# Patient Record
Sex: Male | Born: 1965 | Race: White | Hispanic: No | Marital: Single | Smoking: Never smoker
Health system: Southern US, Community
[De-identification: ages and names within clinical notes are randomized; demographics above are authoritative.]

## PROBLEM LIST (undated history)

## (undated) DIAGNOSIS — F329 Major depressive disorder, single episode, unspecified: Secondary | ICD-10-CM

## (undated) DIAGNOSIS — F32A Depression, unspecified: Secondary | ICD-10-CM

## (undated) DIAGNOSIS — I10 Essential (primary) hypertension: Secondary | ICD-10-CM

## (undated) HISTORY — DX: Major depressive disorder, single episode, unspecified: F32.9

## (undated) HISTORY — DX: Depression, unspecified: F32.A

## (undated) HISTORY — DX: Essential (primary) hypertension: I10

## (undated) HISTORY — PX: OTHER SURGICAL HISTORY: SHX169

---

## 2007-01-05 ENCOUNTER — Emergency Department (HOSPITAL_COMMUNITY): Admission: EM | Admit: 2007-01-05 | Discharge: 2007-01-05 | Payer: Self-pay | Admitting: Family Medicine

## 2017-08-17 ENCOUNTER — Ambulatory Visit (INDEPENDENT_AMBULATORY_CARE_PROVIDER_SITE_OTHER): Payer: PRIVATE HEALTH INSURANCE | Admitting: Family Medicine

## 2017-08-17 ENCOUNTER — Encounter: Payer: Self-pay | Admitting: Family Medicine

## 2017-08-17 VITALS — BP 152/100 | HR 76 | Temp 99.2°F | Ht 66.0 in | Wt 164.1 lb

## 2017-08-17 DIAGNOSIS — R5383 Other fatigue: Secondary | ICD-10-CM | POA: Diagnosis not present

## 2017-08-17 DIAGNOSIS — Z1211 Encounter for screening for malignant neoplasm of colon: Secondary | ICD-10-CM

## 2017-08-17 DIAGNOSIS — F321 Major depressive disorder, single episode, moderate: Secondary | ICD-10-CM | POA: Insufficient documentation

## 2017-08-17 DIAGNOSIS — Z13220 Encounter for screening for lipoid disorders: Secondary | ICD-10-CM

## 2017-08-17 DIAGNOSIS — R030 Elevated blood-pressure reading, without diagnosis of hypertension: Secondary | ICD-10-CM

## 2017-08-17 DIAGNOSIS — Z0001 Encounter for general adult medical examination with abnormal findings: Secondary | ICD-10-CM | POA: Diagnosis not present

## 2017-08-17 NOTE — Progress Notes (Signed)
Subjective:  AZAD CALAME is a 51 y.o. male who presents today for his annual comprehensive physical exam.    HPI:  DASAN HARDMAN has 1 acute complaint today:  Fatigue, new issue Started several months to a year ago.  Has worsened over that time.  Associated symptoms include decreased libido.  He also occasionally has feelings of being down.  He sleeps okay but will occasionally wake up in the middle of the night.  He does have a history of depression and has been on sertraline in the past.  He is not currently interested in any treatment for his depression.  He will be looking into [facility] therapy.  He does not doze off during the day.  Is not sure if he snores.  No paroxysmal nocturnal dyspnea.  PFSH: Current alcohol user.  Drinks about 14-18 drinks per week.  Lifestyle Diet: Room for improvement.  Has been trying to eat more protein and cut out carbs. Exercise: Does not regularly exercise  Depression screen PHQ 2/9 08/17/2017  Decreased Interest 3  Down, Depressed, Hopeless 2  PHQ - 2 Score 5  Altered sleeping 2  Tired, decreased energy 3  Change in appetite 1  Feeling bad or failure about yourself  2  Trouble concentrating 2  Moving slowly or fidgety/restless 2  Suicidal thoughts 0  PHQ-9 Score 17  Difficult doing work/chores Very difficult    Health Maintenance Due  Topic Date Due  . HIV Screening  08/16/1981  . TETANUS/TDAP  08/16/1985  . COLONOSCOPY  08/16/2016    ROS: Per HPI, otherwise a 14 point review of systems was performed and was negative  PMH:  The following were reviewed and entered/updated in epic: History reviewed. No pertinent past medical history. Patient Active Problem List   Diagnosis Date Noted  . Fatigue 08/17/2017  . Depression, major, single episode, moderate ([place]) 08/17/2017   History reviewed. No pertinent surgical history.  Family History  Problem Relation Age of Onset  . COPD Father   . Heart disease Father   . Arthritis  Maternal Grandmother   . Heart attack Paternal Grandmother   . Heart attack Paternal Grandfather     Medications- reviewed and updated No current outpatient prescriptions on file.   No current facility-administered medications for this visit.     Allergies-reviewed and updated No Known Allergies  Social History   Social History  . Marital status: Single    Spouse name: N/A  . Number of children: N/A  . Years of education: N/A   Social History Main Topics  . Smoking status: Never Smoker  . Smokeless tobacco: Never Used  . Alcohol use Yes  . Drug use: No  . Sexual activity: Not Currently   Other Topics Concern  . None   Social History Narrative  . None    Objective:  Physical Exam: BP (!) 152/100   Pulse 76   Temp 99.2 F (37.3 C) (Oral)   Ht 5\' 6"  (1.676 m)   Wt 164 lb 2 oz (74.4 kg)   SpO2 98%   BMI 26.49 kg/m   Body mass index is 26.49 kg/m. Gen: NAD, resting comfortably HEENT: TMs normal bilaterally. OP clear. No thyromegaly noted.  CV: RRR with no murmurs appreciated Pulm: NWOB, CTAB with no crackles, wheezes, or rhonchi GI: Normal bowel sounds present. Soft, Nontender, Nondistended. MSK: no edema, cyanosis, or clubbing noted Skin: warm, dry Neuro: CN2-12 grossly intact. Strength 5/5 in upper and lower extremities. Reflexes symmetric  and intact bilaterally.  Psych: Normal affect and thought content  Assessment/Plan:  Fatigue Broad differential.  Likely related to patient's depression.  He did have elevated PHQ score of 17 today.  Will rule out organic etiology.  Check CBC, CMET, TSH, B12, vitamin D, and testosterone.  Patient is not interested in pharmacotherapy or psychotherapy for his depression at this point.  He will be looking into [facility] therapy.  Discussed reasons to return to care and advised patient to schedule appointment with me if he changes his mind about either pharmacotherapy or psychotherapy.  Depression, major, single episode, moderate  ([place]) See fatigue assessment and plan.  Elevated blood pressure reading Patient has been previously well controlled per his report.  Advised patient continue checking blood pressure at home and let us know if persistently elevated above 140/90.  Patient voiced understanding.  Advised him to follow-up in the office within 3-6 months.   Preventative Healthcare: Flu shot deferred.  Will check lipid panel.  Check glucose on CMET.  Referral placed for colonoscopy.  Patient Counseling:  -Nutrition: Stressed importance of moderation in sodium/caffeine intake, saturated fat and cholesterol, caloric balance, sufficient intake of fresh fruits, vegetables, and fiber.  -Stressed the importance of regular exercise.   -Substance Abuse: Discussed cessation/primary prevention of tobacco, alcohol, or other drug use; driving or other dangerous activities under the influence; availability of treatment for abuse.   -Injury prevention: Discussed safety belts, safety helmets, smoke detector, smoking near bedding or upholstery.   -Sexuality: Discussed sexually transmitted diseases, partner selection, use of condoms, avoidance of unintended pregnancy and contraceptive alternatives.   -Dental health: Discussed importance of regular tooth brushing, flossing, and dental visits.  -Health maintenance and immunizations reviewed. Please refer to Health maintenance section.  Return to care in 1 year for next preventative visit.   Algis Greenhouse. Jerline Pain, MD 08/17/2017 5:03 PM

## 2017-08-17 NOTE — Patient Instructions (Signed)
We will check blood work today.  Let us know if your blood pressure is persistently elevated above 140/90.  Come back to see me in 3-6 months, or sooner as needed.  I placed your referral for your colonoscopy.   Take care,  Dr Jerline Pain

## 2017-08-17 NOTE — Assessment & Plan Note (Signed)
Broad differential.  Likely related to patient's depression.  He did have elevated PHQ score of 17 today.  Will rule out organic etiology.  Check CBC, CMET, TSH, B12, vitamin D, and testosterone.  Patient is not interested in pharmacotherapy or psychotherapy for his depression at this point.  He will be looking into [facility] therapy.  Discussed reasons to return to care and advised patient to schedule appointment with me if he changes his mind about either pharmacotherapy or psychotherapy.

## 2017-08-17 NOTE — Assessment & Plan Note (Signed)
See fatigue assessment and plan.

## 2017-08-18 ENCOUNTER — Other Ambulatory Visit (INDEPENDENT_AMBULATORY_CARE_PROVIDER_SITE_OTHER): Payer: PRIVATE HEALTH INSURANCE

## 2017-08-18 ENCOUNTER — Other Ambulatory Visit: Payer: PRIVATE HEALTH INSURANCE

## 2017-08-18 DIAGNOSIS — R5383 Other fatigue: Secondary | ICD-10-CM

## 2017-08-18 DIAGNOSIS — Z13220 Encounter for screening for lipoid disorders: Secondary | ICD-10-CM | POA: Diagnosis not present

## 2017-08-18 LAB — TESTOSTERONE: TESTOSTERONE: 304.23 ng/dL (ref 300.00–890.00)

## 2017-08-18 LAB — COMPREHENSIVE METABOLIC PANEL
ALT: 15 U/L (ref 0–53)
AST: 17 U/L (ref 0–37)
Albumin: 4.6 g/dL (ref 3.5–5.2)
Alkaline Phosphatase: 34 U/L — ABNORMAL LOW (ref 39–117)
BUN: 14 mg/dL (ref 6–23)
CALCIUM: 9.6 mg/dL (ref 8.4–10.5)
CHLORIDE: 102 meq/L (ref 96–112)
CO2: 32 meq/L (ref 19–32)
Creatinine, Ser: 0.95 mg/dL (ref 0.40–1.50)
GFR: 88.83 mL/min (ref >60.00)
Glucose, Bld: 87 mg/dL (ref 70–99)
Potassium: 4.9 mEq/L (ref 3.5–5.1)
Sodium: 140 mEq/L (ref 135–145)
Total Bilirubin: 0.4 mg/dL (ref 0.2–1.2)
Total Protein: 6.9 g/dL (ref 6.0–8.3)

## 2017-08-18 LAB — CBC
HCT: 47.9 % (ref 39.0–52.0)
HEMOGLOBIN: 15.8 g/dL (ref 13.0–17.0)
MCHC: 32.9 g/dL (ref 30.0–36.0)
MCV: 90 fl (ref 78.0–100.0)
PLATELETS: 220 10*3/uL (ref 150.0–400.0)
RBC: 5.33 Mil/uL (ref 4.22–5.81)
RDW: 14.1 % (ref 11.5–15.5)
WBC: 4.9 10*3/uL (ref 4.0–10.5)

## 2017-08-18 LAB — VITAMIN D 25 HYDROXY (VIT D DEFICIENCY, FRACTURES): VITD: 33.08 ng/mL (ref 30.00–100.00)

## 2017-08-18 LAB — LIPID PANEL
CHOL/HDL RATIO: 4
Cholesterol: 212 mg/dL — ABNORMAL HIGH (ref 0–200)
HDL: 54.7 mg/dL (ref >39.00)
LDL CALC: 131 mg/dL — AB (ref 0–99)
NONHDL: 156.81
TRIGLYCERIDES: 128 mg/dL (ref 0.0–149.0)
VLDL: 25.6 mg/dL (ref 0.0–40.0)

## 2017-08-18 LAB — TSH: TSH: 2.7 u[IU]/mL (ref 0.35–4.50)

## 2017-08-18 LAB — VITAMIN B12: VITAMIN B 12: 506 pg/mL (ref 211–911)

## 2017-08-27 ENCOUNTER — Encounter: Payer: Self-pay | Admitting: Internal Medicine

## 2017-09-15 ENCOUNTER — Ambulatory Visit (AMBULATORY_SURGERY_CENTER): Payer: Self-pay | Admitting: *Deleted

## 2017-09-15 ENCOUNTER — Other Ambulatory Visit: Payer: Self-pay

## 2017-09-15 VITALS — Ht 66.0 in | Wt 169.0 lb

## 2017-09-15 DIAGNOSIS — Z1211 Encounter for screening for malignant neoplasm of colon: Secondary | ICD-10-CM

## 2017-09-15 MED ORDER — NA SULFATE-K SULFATE-MG SULF 17.5-3.13-1.6 GM/177ML PO SOLN: 1.0000 | Freq: Once | ORAL | 0 refills | Status: AC

## 2017-09-15 NOTE — Progress Notes (Signed)
No egg or soy allergy known to patient  No issues with past sedation with any surgeries  or procedures, no intubation problems  No diet pills per patient No home 02 use per patient  No blood thinners per patient  Pt denies issues with constipation  No A fib or A flutter  EMMI video sent to pt's e mail -- pt declined $15 coupon for suprep in [place] today

## 2017-09-29 ENCOUNTER — Ambulatory Visit (AMBULATORY_SURGERY_CENTER): Payer: PRIVATE HEALTH INSURANCE | Admitting: Internal Medicine

## 2017-09-29 ENCOUNTER — Other Ambulatory Visit: Payer: Self-pay

## 2017-09-29 ENCOUNTER — Encounter: Payer: Self-pay | Admitting: Internal Medicine

## 2017-09-29 ENCOUNTER — Encounter: Payer: PRIVATE HEALTH INSURANCE | Admitting: Gastroenterology

## 2017-09-29 VITALS — BP 136/84 | HR 71 | Temp 97.7°F | Resp 12 | Ht 66.0 in | Wt 169.0 lb

## 2017-09-29 DIAGNOSIS — D120 Benign neoplasm of cecum: Secondary | ICD-10-CM

## 2017-09-29 DIAGNOSIS — D123 Benign neoplasm of transverse colon: Secondary | ICD-10-CM

## 2017-09-29 DIAGNOSIS — Z1211 Encounter for screening for malignant neoplasm of colon: Secondary | ICD-10-CM

## 2017-09-29 MED ORDER — SODIUM CHLORIDE 0.9 % IV SOLN: 500.0000 mL | Freq: Once | INTRAVENOUS | Status: DC

## 2017-09-29 NOTE — Patient Instructions (Signed)
   handout given: Polyps, Diverticulosis and Hemorrhoids.  YOU HAD AN ENDOSCOPIC PROCEDURE TODAY AT [facility]:   Refer to the procedure report that was given to you for any specific questions about what was found during the examination.  If the procedure report does not answer your questions, please call your gastroenterologist to clarify.  If you requested that your care partner not be given the details of your procedure findings, then the procedure report has been included in a sealed envelope for you to review at your convenience later.  YOU SHOULD EXPECT: Some feelings of bloating in the abdomen. Passage of more gas than usual.  Walking can help get rid of the air that was put into your GI tract during the procedure and reduce the bloating. If you had a lower endoscopy (such as a colonoscopy or flexible sigmoidoscopy) you may notice spotting of blood in your stool or on the toilet paper. If you underwent a bowel prep for your procedure, you may not have a normal bowel movement for a few days.  Please Note:  You might notice some irritation and congestion in your nose or some drainage.  This is from the oxygen used during your procedure.  There is no need for concern and it should clear up in a day or so.  SYMPTOMS TO REPORT IMMEDIATELY:   Following lower endoscopy (colonoscopy or flexible sigmoidoscopy):  Excessive amounts of blood in the stool  Significant tenderness or worsening of abdominal pains  Swelling of the abdomen that is new, acute  Fever of 100F or higher  For urgent or emergent issues, a gastroenterologist can be reached at any hour by calling [phone].   DIET:  We do recommend a small meal at first, but then you may proceed to your regular diet.  Drink plenty of fluids but you should avoid alcoholic beverages for 24 hours.  ACTIVITY:  You should plan to take it easy for the rest of today and you should NOT DRIVE or use heavy machinery until tomorrow  (because of the sedation medicines used during the test).    FOLLOW UP: Our staff will call the number listed on your records the next business day following your procedure to check on you and address any questions or concerns that you may have regarding the information given to you following your procedure. If we do not reach you, we will leave a message.  However, if you are feeling well and you are not experiencing any problems, there is no need to return our call.  We will assume that you have returned to your regular daily activities without incident.  If any biopsies were taken you will be contacted by phone or by letter within the next 1-3 weeks.  Please call us at [phone] if you have not heard about the biopsies in 3 weeks.    SIGNATURES/CONFIDENTIALITY: You and/or your care partner have signed paperwork which will be entered into your electronic medical record.  These signatures attest to the fact that that the information above on your After Visit Summary has been reviewed and is understood.  Full responsibility of the confidentiality of this discharge information lies with you and/or your care-partner.

## 2017-09-29 NOTE — Progress Notes (Signed)
To recovery, report to RN, VSS. 

## 2017-09-29 NOTE — Progress Notes (Signed)
Called to room to assist during endoscopic procedure.  Patient ID and intended procedure confirmed with present staff. Received instructions for my participation in the procedure from the performing physician.  

## 2017-09-29 NOTE — Op Note (Signed)
[facility] Patient Name: Wesley Frost Procedure Date: 09/29/2017 2:49 PM MRN: [id number] Endoscopist: Jerene Bears , MD Age: 51 Referring MD:  Date of Birth: 05-23-1966 Gender: Male Account #: [id number] Procedure:                Colonoscopy Indications:              Screening for colorectal malignant neoplasm, This                            is the patient's first colonoscopy Medicines:                Monitored Anesthesia Care Procedure:                Pre-Anesthesia Assessment:                           - Prior to the procedure, a History and Physical                            was performed, and patient medications and                            allergies were reviewed. The patient's tolerance of                            previous anesthesia was also reviewed. The risks                            and benefits of the procedure and the sedation                            options and risks were discussed with the patient.                            All questions were answered, and informed consent                            was obtained. Prior Anticoagulants: The patient has                            taken no previous anticoagulant or antiplatelet                            agents. ASA Grade Assessment: II - A patient with                            mild systemic disease. After reviewing the risks                            and benefits, the patient was deemed in                            satisfactory condition to undergo the procedure.  After obtaining informed consent, the colonoscope                            was passed under direct vision. Throughout the                            procedure, the patient's blood pressure, pulse, and                            oxygen saturations were monitored continuously. The                            Colonoscope was introduced through the anus and                            advanced to the the cecum,  identified by                            appendiceal orifice and ileocecal valve. The                            colonoscopy was performed without difficulty. The                            patient tolerated the procedure well. The quality                            of the bowel preparation was good. The ileocecal                            valve, appendiceal orifice, and rectum were                            photographed. Scope In: 3:09:37 PM Scope Out: 3:24:45 PM Scope Withdrawal Time: 0 hours 11 minutes 32 seconds  Total Procedure Duration: 0 hours 15 minutes 8 seconds  Findings:                 The digital rectal exam was normal.                           Three sessile polyps were found in the cecum. The                            polyps were 3 to 5 mm in size. These polyps were                            removed with a cold snare. Resection and retrieval                            were complete.                           A 3 mm polyp was found in the transverse colon. The  polyp was sessile. The polyp was removed with a                            cold snare. Resection and retrieval were complete.                           Multiple small-mouthed diverticula were found in                            the sigmoid colon and descending colon.                           Internal hemorrhoids were found during                            retroflexion. The hemorrhoids were small. Complications:            No immediate complications. Estimated Blood Loss:     Estimated blood loss was minimal. Impression:               - Three 3 to 5 mm polyps in the cecum, removed with                            a cold snare. Resected and retrieved.                           - One 3 mm polyp in the transverse colon, removed                            with a cold snare. Resected and retrieved.                           - Moderate diverticulosis in the sigmoid colon and                             in the descending colon.                           - Small internal hemorrhoids. Recommendation:           - Patient has a contact number available for                            emergencies. The signs and symptoms of potential                            delayed complications were discussed with the                            patient. Return to normal activities tomorrow.                            Written discharge instructions were provided to the  patient.                           - Resume previous diet.                           - Continue present medications.                           - Await pathology results.                           - Repeat colonoscopy for surveillance based on                            pathology results. Jerene Bears, MD 09/29/2017 3:29:00 PM This report has been signed electronically.

## 2017-09-30 ENCOUNTER — Telehealth: Payer: Self-pay

## 2017-09-30 ENCOUNTER — Telehealth: Payer: Self-pay | Admitting: *Deleted

## 2017-09-30 NOTE — Telephone Encounter (Signed)
Message left

## 2017-09-30 NOTE — Telephone Encounter (Signed)
  Follow up Call-  Call back number 09/29/2017  Permission to leave phone message Yes  Some recent data might be hidden     Left message

## 2017-10-05 ENCOUNTER — Encounter: Payer: Self-pay | Admitting: Internal Medicine

## 2018-04-11 ENCOUNTER — Telehealth: Payer: Self-pay | Admitting: Family Medicine

## 2018-04-11 ENCOUNTER — Ambulatory Visit: Payer: PRIVATE HEALTH INSURANCE | Admitting: Family Medicine

## 2018-04-11 ENCOUNTER — Ambulatory Visit (INDEPENDENT_AMBULATORY_CARE_PROVIDER_SITE_OTHER)
Admission: RE | Admit: 2018-04-11 | Discharge: 2018-04-11 | Disposition: A | Discharge status: Home / Self Care | Payer: PRIVATE HEALTH INSURANCE | Source: Ambulatory Visit | Attending: Family Medicine | Admitting: Family Medicine

## 2018-04-11 ENCOUNTER — Other Ambulatory Visit: Payer: Self-pay

## 2018-04-11 ENCOUNTER — Encounter: Payer: Self-pay | Admitting: Family Medicine

## 2018-04-11 ENCOUNTER — Ambulatory Visit (INDEPENDENT_AMBULATORY_CARE_PROVIDER_SITE_OTHER): Payer: PRIVATE HEALTH INSURANCE

## 2018-04-11 VITALS — BP 132/80 | HR 75 | Temp 98.6°F | Ht 66.0 in | Wt 173.4 lb

## 2018-04-11 DIAGNOSIS — M25532 Pain in left wrist: Secondary | ICD-10-CM | POA: Diagnosis not present

## 2018-04-11 DIAGNOSIS — S0181XA Laceration without foreign body of other part of head, initial encounter: Secondary | ICD-10-CM | POA: Diagnosis not present

## 2018-04-11 DIAGNOSIS — G44309 Post-traumatic headache, unspecified, not intractable: Secondary | ICD-10-CM

## 2018-04-11 DIAGNOSIS — S52502A Unspecified fracture of the lower end of left radius, initial encounter for closed fracture: Secondary | ICD-10-CM

## 2018-04-11 MED ORDER — DICLOFENAC SODIUM 75 MG PO TBEC: 75.0000 mg | DELAYED_RELEASE_TABLET | Freq: Two times a day (BID) | ORAL | 0 refills | Status: DC

## 2018-04-11 NOTE — Patient Instructions (Signed)
It was very nice to see you today!  We placed 4 sutures on your forehead today.  Please keep this area clean and dry.  Please come back on Friday to have the stitches removed.  We will check a head CT to make sure you do not have any signs of internal bleeding.  You have a left wrist fracture.  Please wear the splint as much as possible.  I would like for you to follow-up with Dr. Paulla Fore in 1 to 2 weeks.  Take care, Dr Jerline Pain   Laceration Care, Adult A laceration is a cut that goes through all layers of the skin. The cut also goes into the tissue that is right under the skin. Some cuts heal on their own. Others need to be closed with stitches (sutures), staples, skin adhesive strips, or wound glue. Taking care of your cut lowers your risk of infection and helps your cut to heal better. How to take care of your cut For stitches or staples:  Keep the wound clean and dry.  If you were given a bandage (dressing), you should change it at least one time per day or as told by your doctor. You should also change it if it gets wet or dirty.  Keep the wound completely dry for the first 24 hours or as told by your doctor. After that time, you may take a shower or a bath. However, make sure that the wound is not soaked in water until after the stitches or staples have been removed.  Clean the wound one time each day or as told by your doctor: ? Wash the wound with soap and water. ? Rinse the wound with water until all of the soap comes off. ? Pat the wound dry with a clean towel. Do not rub the wound.  After you clean the wound, put a thin layer of antibiotic ointment on it as told by your doctor. This ointment: ? Helps to prevent infection. ? Keeps the bandage from sticking to the wound.  Have your stitches or staples removed as told by your doctor. If your doctor used skin adhesive strips:  Keep the wound clean and dry.  If you were given a bandage, you should change it at least one time  per day or as told by your doctor. You should also change it if it gets dirty or wet.  Do not get the skin adhesive strips wet. You can take a shower or a bath, but be careful to keep the wound dry.  If the wound gets wet, pat it dry with a clean towel. Do not rub the wound.  Skin adhesive strips fall off on their own. You can trim the strips as the wound heals. Do not remove any strips that are still stuck to the wound. They will fall off after a while. If your doctor used wound glue:  Try to keep your wound dry, but you may briefly wet it in the shower or bath. Do not soak the wound in water, such as by swimming.  After you take a shower or a bath, gently pat the wound dry with a clean towel. Do not rub the wound.  Do not do any activities that will make you really sweaty until the skin glue has fallen off on its own.  Do not apply liquid, cream, or ointment medicine to your wound while the skin glue is still on.  If you were given a bandage, you should change it at least  one time per day or as told by your doctor. You should also change it if it gets dirty or wet.  If a bandage is placed over the wound, do not let the tape for the bandage touch the skin glue.  Do not pick at the glue. The skin glue usually stays on for 5-10 days. Then, it falls off of the skin. General Instructions  To help prevent scarring, make sure to cover your wound with sunscreen whenever you are outside after stitches are removed, after adhesive strips are removed, or when wound glue stays in place and the wound is healed. Make sure to wear a sunscreen of at least 30 SPF.  Take over-the-counter and prescription medicines only as told by your doctor.  If you were given antibiotic medicine or ointment, take or apply it as told by your doctor. Do not stop using the antibiotic even if your wound is getting better.  Do not scratch or pick at the wound.  Keep all follow-up visits as told by your doctor. This is  important.  Check your wound every day for signs of infection. Watch for: ? Redness, swelling, or pain. ? Fluid, blood, or pus.  Raise (elevate) the injured area above the level of your heart while you are sitting or lying down, if possible. Get help if:  You got a tetanus shot and you have any of these problems at the injection site: ? Swelling. ? Very bad pain. ? Redness. ? Bleeding.  You have a fever.  A wound that was closed breaks open.  You notice a bad smell coming from your wound or your bandage.  You notice something coming out of the wound, such as wood or glass.  Medicine does not help your pain.  You have more redness, swelling, or pain at the site of your wound.  You have fluid, blood, or pus coming from your wound.  You notice a change in the color of your skin near your wound.  You need to change the bandage often because fluid, blood, or pus is coming from the wound.  You start to have a new rash.  You start to have numbness around the wound. Get help right away if:  You have very bad swelling around the wound.  Your pain suddenly gets worse and is very bad.  You notice painful lumps near the wound or on skin that is anywhere on your body.  You have a red streak going away from your wound.  The wound is on your hand or foot and you cannot move a finger or toe like you usually can.  The wound is on your hand or foot and you notice that your fingers or toes look pale or bluish. This information is not intended to replace advice given to you by your health care provider. Make sure you discuss any questions you have with your health care provider. Document Released: 03/23/2008 Document Revised: 03/12/2016 Document Reviewed: 10/01/2014 Elsevier Interactive Patient Education  [employer].

## 2018-04-11 NOTE — Telephone Encounter (Signed)
Called and took report.  See imaging report.

## 2018-04-11 NOTE — Progress Notes (Addendum)
   Subjective:  Wesley Frost is a 52 y.o. male who presents today for same-day appointment with a chief complaint of forehead laceration.   HPI:  Forehead Laceration, acute problem Patient fell off ladder yesterday around 6pm.  Patient was about 7 feet off the ground when he lost control ladder and fell to the ground.  Patient try to brace himself and landed on his left wrist, knees, and left side of his face.  Immediately noticed pain to all these areas.  This morning started having some room spinning sensations which lasted for a few minutes and has since subsided.  Denies any LOC at the time of injury. He has also had some nausea on and off.  Patient suffered a laceration to the left part of his forehead.  One of his neighbors placed Steri-Strips to the area.  He has had continued swelling and pain to the area.   ROS: Per HPI  PMH: He reports that he has never smoked. He has never used smokeless tobacco. He reports that he drinks alcohol. He reports that he does not use drugs.  Objective:  Physical Exam: BP 132/80 (BP Location: Right Arm, Patient Position: Sitting, Cuff Size: Normal)   Pulse 75   Temp 98.6 F (37 C) (Oral)   Ht 5\' 6"  (1.676 m)   Wt 173 lb 6.4 oz (78.7 kg)   SpO2 96%   BMI 27.99 kg/m   Gen: NAD, resting comfortably CV: RRR with no murmurs appreciated Pulm: NWOB, CTAB with no crackles, wheezes, or rhonchi Skin: Approximately 2 cm x 2 cm stellate laceration on left lateral forehead.  Significant surrounding edema.  Minor abrasions noted to left arm and knees bilaterally. MSK: Left wrist tender to palpation along distal radius.  Edema noted. Neurovascularly intact distally.  Neuro: CN2-12 intact. Strength 5/5 in upper and lower extremities. Sensation to light touch intact throughout. Normal gait. No gross abnormalities.   Laceration Repair Procedure Note Patient presented with a approximately 2 cm stellate laceration on his left lateral forehead.  Written  consent was obtained for laceration repair.  Area was irrigated with copious amount of normal saline.  Area was prepped with Betadine swab.  Approximately 3 cc of 2% lidocaine without epinephrine were then infiltrated into the wound.  After adequate anesthesia was obtained, 4 simple interrupted sutures were placed using 5-0 Prolene. Topical antibiotic ointment was then applied and the wound was dressed.  Patient tolerated procedure well without complication.  Estimated blood loss approximately 1 cc.  Assessment/Plan:  Forehead laceration/headache/vertigo Laceration repaired today.  Please see above procedure note.  Given his neurological symptoms including headache, and intermittent vertigo in addition to head trauma, we will check a stat head CT to rule out intracranial bleed, however we have a relatively low suspicion for this given his normal neurological exam in the office today. He was able to ambulate normally in the office. Patient will follow-up in 5 days to have sutures removed.  Left distal radius fracture Apparent based on my read of his x-ray. Nondisplaced. Will await radiology read.  Patient will be placed in left wrist splint today.  He will follow-up with sports medicine in 1 to 2 weeks - may need to see orthopedics early depending on radiologist read.  Prescription for diclofenac 75 mg twice daily was provided for pain relief.  He will continue using Tylenol as needed. Discussed reasons to return to care.   Algis Greenhouse. Jerline Pain, MD 04/11/2018 9:01 AM

## 2018-04-11 NOTE — Telephone Encounter (Signed)
Roy from Radiology calling back again in reference to note below. Please advise.

## 2018-04-11 NOTE — Telephone Encounter (Signed)
See note.   Copied from [place] [id number]. Topic: General - Other >> Apr 11, 2018 10:11 AM Yvette Rack wrote: Reason for CRM: Carloyn Manner from radiology calling for report on lt wrist -phone number [phone]; nurses was on the line talking to other patients

## 2018-04-15 ENCOUNTER — Encounter: Payer: Self-pay | Admitting: Family Medicine

## 2018-04-15 ENCOUNTER — Ambulatory Visit: Payer: PRIVATE HEALTH INSURANCE | Admitting: Family Medicine

## 2018-04-15 VITALS — BP 130/78 | HR 84 | Temp 98.5°F | Ht 66.0 in | Wt 169.0 lb

## 2018-04-15 DIAGNOSIS — S060X0D Concussion without loss of consciousness, subsequent encounter: Secondary | ICD-10-CM

## 2018-04-15 DIAGNOSIS — S0181XD Laceration without foreign body of other part of head, subsequent encounter: Secondary | ICD-10-CM | POA: Diagnosis not present

## 2018-04-15 DIAGNOSIS — S52502D Unspecified fracture of the lower end of left radius, subsequent encounter for closed fracture with routine healing: Secondary | ICD-10-CM

## 2018-04-15 NOTE — Patient Instructions (Signed)
It was very nice to see you today!  We removed your stiches today. Please keep this area bandaged for the next week.  You can gently clean the area with soap and water, but please avoid scrubbing vigorously.  I am glad that your concussion symptoms are improving.  They should continue to improve over the next 1 to 2 weeks.  Please stop any activity that makes her symptoms worse.  Please let me know if your symptoms do not improve over the next 1 to 2 weeks.  It is very important that you follow-up with the orthopedist for your wrist fracture make sure it is healing properly.  Take care, Dr Jerline Pain

## 2018-04-15 NOTE — Progress Notes (Signed)
   Subjective:  Wesley Frost is a 52 y.o. male who presents today with a chief complaint of concussion follow up.   HPI:  Concussion, Established problem, improving Patient seen 5 days ago for concussion-like symptoms.  This was the setting of falling off a ladder approximately 7 feet in the air.  Head CT was performed at that time which was negative.  Symptoms at that time included headache, and vertigo.  Patient reports that his symptoms have significantly improved over the past several days.  Patient was able to go to work yesterday without issue.  No new numbness or weakness.  No vision changes.  Facial laceration, established problem, improving Patient had a laceration repair to his left forehead 5 days ago.  Thinks that it is done well.  Swelling to the area is improving.  Left distal radius fracture, established problem, stable Patient also diagnosed with left distal radius fracture there is visit 5 days ago.  Patient was placed in a splint and advised to follow-up with orthopedics.  He has been called to schedule appointment, but has not yet followed up with them. Pain is stable.  ROS: Per HPI  PMH: He reports that he has never smoked. He has never used smokeless tobacco. He reports that he drinks alcohol. He reports that he does not use drugs.  Objective:  Physical Exam: BP 130/78 (BP Location: Right Arm, Patient Position: Sitting, Cuff Size: Normal)   Pulse 84   Temp 98.5 F (36.9 C) (Oral)   Ht 5\' 6"  (1.676 m)   Wt 169 lb (76.7 kg)   SpO2 96%   BMI 27.28 kg/m   Gen: NAD, resting comfortably MSK: Left forearm in splint Skin: Warm, dry. 2cm jagged healing laceration on left lateral forehead with surrounding ecchymosis Neuro: Grossly normal, moves all extremities Psych: Normal affect and thought content  Suture Removal Procedure Note: Patient presents for suture removal. The wound is well healed without signs of infection.  The sutures are removed. Wound  care and activity instructions given. Return prn.   Assessment/Plan:  Concussion Seems to be recovering normally.  No red flag signs or symptoms.  Discussed typical course of illness and reasons to return to care.  Discussed importance of avoidance of activities that make the symptoms worse.  If symptoms persist for another 1 to 2 weeks, would consider referral to neurology.  Discussed reasons return to care.  Facial laceration Sutures successfully removed today.  Patient tolerated well without. Would was bandaged today. Discussed wound care and reasons to return to care.   Left Distal Radius Fracture Seems to be healing normally.  Discussed importance of orthopedic follow-up given the extent of his fracture and high risk for decreased risk mobility or other complications of non-routine healing.  Patient voiced understanding and stated he would call to schedule appointment. Continue diclofenac as needed.   Algis Greenhouse. Jerline Pain, MD 04/15/2018 8:29 AM

## 2018-04-18 ENCOUNTER — Encounter (INDEPENDENT_AMBULATORY_CARE_PROVIDER_SITE_OTHER): Payer: Self-pay | Admitting: Physician Assistant

## 2018-04-18 ENCOUNTER — Ambulatory Visit (INDEPENDENT_AMBULATORY_CARE_PROVIDER_SITE_OTHER): Payer: PRIVATE HEALTH INSURANCE

## 2018-04-18 ENCOUNTER — Ambulatory Visit (INDEPENDENT_AMBULATORY_CARE_PROVIDER_SITE_OTHER): Payer: PRIVATE HEALTH INSURANCE | Admitting: Physician Assistant

## 2018-04-18 DIAGNOSIS — S52572A Other intraarticular fracture of lower end of left radius, initial encounter for closed fracture: Secondary | ICD-10-CM | POA: Diagnosis not present

## 2018-04-18 DIAGNOSIS — M25532 Pain in left wrist: Secondary | ICD-10-CM

## 2018-04-18 DIAGNOSIS — S62102A Fracture of unspecified carpal bone, left wrist, initial encounter for closed fracture: Secondary | ICD-10-CM

## 2018-04-18 NOTE — Progress Notes (Signed)
Office Visit Note   Patient: Wesley Frost           Date of Birth: 03-27-1966           MRN: [id number] Visit Date: 04/18/2018              Requested by: Vivi Barrack, MD [address] [place], [place] [address] PCP: Vivi Barrack, MD   Assessment & Plan: Visit Diagnoses:  1. Pain in left wrist   2. Closed fracture of left wrist, initial encounter     Plan: Placed him in a well-padded short arm cast with Xeroform over his abrasions.  Follow up in 2 weeks we will remove the cast and obtain AP and lateral views of the left wrist at that time.  He is to keep the cast clean dry and intact.  Follow-Up Instructions: Return in about 2 weeks (around 05/02/2018).   Orders:  Orders Placed This Encounter  Procedures  . XR Wrist Complete Left   No orders of the defined types were placed in this encounter.     Procedures: No procedures performed   Clinical Data: No additional findings.   Subjective: Chief Complaint  Patient presents with  . Left Wrist - Injury    HPI Wesley Frost is a 52 year old male who was trimming some bushes on a ladder on 04/15/2018 when he fell coming down onto his left wrist.  He feels there may have been slight loss of consciousness.  He was seen at his primary care sent for radiographs and was found to have a nondisplaced distal radius fracture with intra-articular involvement.  Had no other injuries other than some slight abrasions to the arm.  Is placed in a wrist splint.  He is here today for follow-up and definitive treatment. Review of Systems Please see HPI otherwise negative  Objective: Vital Signs: There were no vitals taken for this visit.  Physical Exam  Constitutional: He is oriented to person, place, and time. He appears well-developed and well-nourished. No distress.  Neurological: He is alert and oriented to person, place, and time.  Skin: He is not diaphoretic.  Psychiatric: He has a normal mood and affect.     Ortho Exam Left wrist has abrasions about the forearm and wrist no signs of infection.  Radial pulses intact.  Sensation grossly intact throughout the hand and full motor throughout the hand.  He has no tenderness of the elbow.  Full supination pronation forearm. Specialty Comments:  No specialty comments available.  Imaging: Xr Wrist Complete Left  Result Date: 04/18/2018 Left wrist: 3 views show a intra-articular distal radius fracture with slight compaction.  Overall position alignment near anatomic otherwise.  No other fractures identified.  Wrist is well located.    PMFS History: Patient Active Problem List   Diagnosis Date Noted  . Fatigue 08/17/2017  . Depression, major, single episode, moderate ([place]) 08/17/2017   Past Medical History:  Diagnosis Date  . Depression   . Hypertension    no meds     Family History  Problem Relation Age of Onset  . COPD Father   . Heart disease Father   . Arthritis Maternal Grandmother   . Heart attack Paternal Grandmother   . Heart attack Paternal Grandfather   . Colon cancer Neg Hx   . Colon polyps Neg Hx   . Esophageal cancer Neg Hx   . Rectal cancer Neg Hx   . Stomach cancer Neg Hx   . Pancreatic  cancer Neg Hx   . Breast cancer Neg Hx     Past Surgical History:  Procedure Laterality Date  . no past surgeries     Social History   Occupational History  . Not on file  Tobacco Use  . Smoking status: Never Smoker  . Smokeless tobacco: Never Used  Substance and Sexual Activity  . Alcohol use: Yes    Comment: occasionally   . Drug use: No  . Sexual activity: Not Currently

## 2018-05-04 ENCOUNTER — Ambulatory Visit (INDEPENDENT_AMBULATORY_CARE_PROVIDER_SITE_OTHER): Payer: PRIVATE HEALTH INSURANCE | Admitting: Orthopaedic Surgery

## 2018-05-04 ENCOUNTER — Encounter (INDEPENDENT_AMBULATORY_CARE_PROVIDER_SITE_OTHER): Payer: Self-pay | Admitting: Orthopaedic Surgery

## 2018-05-04 ENCOUNTER — Ambulatory Visit (INDEPENDENT_AMBULATORY_CARE_PROVIDER_SITE_OTHER): Payer: PRIVATE HEALTH INSURANCE

## 2018-05-04 DIAGNOSIS — S62102A Fracture of unspecified carpal bone, left wrist, initial encounter for closed fracture: Secondary | ICD-10-CM | POA: Diagnosis not present

## 2018-05-04 NOTE — Progress Notes (Signed)
HPI: Mr. Rolene Arbour returns today almost 3 weeks status post left distal radius fracture.  He is overall doing well.  Is been in a short arm cast.  He is not taking anything for pain.  He has had minimal discomfort.  Physical exam: Left wrist he has minimal tenderness with palpation over the distal radius.  No overall gross deformity.  Radial pulses intact.  Full motor and full sensation of the right hand.  Nontender at the elbow.  Radiographs:3 views left wrist: Interval healing of the distal radius fracture with no change in overall position alignment.  Impression: 3 weeks status post left wrist fracture Plan: He is placed in a removable splint which he brings with him today.  He will use this as a cast only coming out of it for hygiene purposes.  No heavy lifting or weightbearing on the left wrist.  We will see him back in 1 month at that time we will obtain AP lateral and oblique views of the wrist.  Depending upon his range of motion may consider physical therapy for the wrist at that time.  Questions were encouraged and answered .

## 2018-06-01 ENCOUNTER — Ambulatory Visit (INDEPENDENT_AMBULATORY_CARE_PROVIDER_SITE_OTHER): Payer: PRIVATE HEALTH INSURANCE | Admitting: Orthopaedic Surgery

## 2018-10-06 ENCOUNTER — Ambulatory Visit (INDEPENDENT_AMBULATORY_CARE_PROVIDER_SITE_OTHER): Payer: PRIVATE HEALTH INSURANCE | Admitting: Family Medicine

## 2018-10-06 ENCOUNTER — Encounter: Payer: Self-pay | Admitting: Family Medicine

## 2018-10-06 VITALS — BP 132/80 | HR 62 | Temp 97.8°F | Ht 66.0 in | Wt 170.0 lb

## 2018-10-06 DIAGNOSIS — Z0001 Encounter for general adult medical examination with abnormal findings: Secondary | ICD-10-CM

## 2018-10-06 DIAGNOSIS — E785 Hyperlipidemia, unspecified: Secondary | ICD-10-CM | POA: Insufficient documentation

## 2018-10-06 LAB — LIPID PANEL
CHOL/HDL RATIO: 4
Cholesterol: 222 mg/dL — ABNORMAL HIGH (ref 0–200)
HDL: 57.5 mg/dL (ref >39.00)
LDL CALC: 142 mg/dL — AB (ref 0–99)
NONHDL: 164.85
TRIGLYCERIDES: 112 mg/dL (ref 0.0–149.0)
VLDL: 22.4 mg/dL (ref 0.0–40.0)

## 2018-10-06 LAB — CBC
HCT: 43.5 % (ref 39.0–52.0)
HEMOGLOBIN: 14.8 g/dL (ref 13.0–17.0)
MCHC: 34.1 g/dL (ref 30.0–36.0)
MCV: 87.5 fl (ref 78.0–100.0)
PLATELETS: 236 10*3/uL (ref 150.0–400.0)
RBC: 4.96 Mil/uL (ref 4.22–5.81)
RDW: 13.6 % (ref 11.5–15.5)
WBC: 5.6 10*3/uL (ref 4.0–10.5)

## 2018-10-06 LAB — COMPREHENSIVE METABOLIC PANEL
ALT: 15 U/L (ref 0–53)
AST: 16 U/L (ref 0–37)
Albumin: 4.6 g/dL (ref 3.5–5.2)
Alkaline Phosphatase: 38 U/L — ABNORMAL LOW (ref 39–117)
BUN: 13 mg/dL (ref 6–23)
CALCIUM: 9.3 mg/dL (ref 8.4–10.5)
CHLORIDE: 100 meq/L (ref 96–112)
CO2: 29 meq/L (ref 19–32)
Creatinine, Ser: 1.02 mg/dL (ref 0.40–1.50)
GFR: 81.47 mL/min (ref >60.00)
GLUCOSE: 82 mg/dL (ref 70–99)
Potassium: 4.1 mEq/L (ref 3.5–5.1)
Sodium: 137 mEq/L (ref 135–145)
Total Bilirubin: 0.8 mg/dL (ref 0.2–1.2)
Total Protein: 6.9 g/dL (ref 6.0–8.3)

## 2018-10-06 NOTE — Patient Instructions (Signed)
It was very nice to see you today!  We will check blood work today.  Come back to see me in 1 year or sooner as needed.  Take care, Dr Jerline Pain   Preventive Care 40-64 Years, Male Preventive care refers to lifestyle choices and visits with your health care provider that can promote health and wellness. What does preventive care include?   A yearly physical exam. This is also called an annual well check.  Dental exams once or twice a year.  Routine eye exams. Ask your health care provider how often you should have your eyes checked.  Personal lifestyle choices, including: ? Daily care of your teeth and gums. ? Regular physical activity. ? Eating a healthy diet. ? Avoiding tobacco and drug use. ? Limiting alcohol use. ? Practicing safe sex. ? Taking low-dose aspirin every day starting at age 52. What happens during an annual well check? The services and screenings done by your health care provider during your annual well check will depend on your age, overall health, lifestyle risk factors, and family history of disease. Counseling Your health care provider may ask you questions about your:  Alcohol use.  Tobacco use.  Drug use.  Emotional well-being.  Home and relationship well-being.  Sexual activity.  Eating habits.  Work and work Statistician. Screening You may have the following tests or measurements:  Height, weight, and BMI.  Blood pressure.  Lipid and cholesterol levels. These may be checked every 5 years, or more frequently if you are over 42 years old.  Skin check.  Lung cancer screening. You may have this screening every year starting at age 52 if you have a 30-pack-year history of smoking and currently smoke or have quit within the past 15 years.  Colorectal cancer screening. All adults should have this screening starting at age 52 and continuing until age 21. Your health care provider may recommend screening at age 19. You will have tests every  1-10 years, depending on your results and the type of screening test. People at increased risk should start screening at an earlier age. Screening tests may include: ? Guaiac-based fecal occult blood testing. ? Fecal immunochemical test (FIT). ? Stool DNA test. ? Virtual colonoscopy. ? Sigmoidoscopy. During this test, a flexible tube with a tiny camera (sigmoidoscope) is used to examine your rectum and lower colon. The sigmoidoscope is inserted through your anus into your rectum and lower colon. ? Colonoscopy. During this test, a long, thin, flexible tube with a tiny camera (colonoscope) is used to examine your entire colon and rectum.  Prostate cancer screening. Recommendations will vary depending on your family history and other risks.  Hepatitis C blood test.  Hepatitis B blood test.  Sexually transmitted disease (STD) testing.  Diabetes screening. This is done by checking your blood sugar (glucose) after you have not eaten for a while (fasting). You may have this done every 1-3 years. Discuss your test results, treatment options, and if necessary, the need for more tests with your health care provider. Vaccines Your health care provider may recommend certain vaccines, such as:  Influenza vaccine. This is recommended every year.  Tetanus, diphtheria, and acellular pertussis (Tdap, Td) vaccine. You may need a Td booster every 10 years.  Varicella vaccine. You may need this if you have not been vaccinated.  Zoster vaccine. You may need this after age 1.  Measles, mumps, and rubella (MMR) vaccine. You may need at least one dose of MMR if you were born in  1957 or later. You may also need a second dose.  Pneumococcal 13-valent conjugate (PCV13) vaccine. You may need this if you have certain conditions and have not been vaccinated.  Pneumococcal polysaccharide (PPSV23) vaccine. You may need one or two doses if you smoke cigarettes or if you have certain conditions.  Meningococcal  vaccine. You may need this if you have certain conditions.  Hepatitis A vaccine. You may need this if you have certain conditions or if you travel or work in places where you may be exposed to hepatitis A.  Hepatitis B vaccine. You may need this if you have certain conditions or if you travel or work in places where you may be exposed to hepatitis B.  Haemophilus influenzae type b (Hib) vaccine. You may need this if you have certain risk factors. Talk to your health care provider about which screenings and vaccines you need and how often you need them. This information is not intended to replace advice given to you by your health care provider. Make sure you discuss any questions you have with your health care provider. Document Released: 11/01/2015 Document Revised: 11/25/2017 Document Reviewed: 08/06/2015 Elsevier Interactive Patient Education  2019 [employer].

## 2018-10-06 NOTE — Progress Notes (Signed)
Subjective:  Wesley Frost is a 52 y.o. male who presents today for his annual comprehensive physical exam.    HPI:  He has no acute complaints today.   Lifestyle Diet: No specific diets.  Exercise: No specific exercises.   Depression screen PHQ 2/9 10/06/2018  Decreased Interest 0  Down, Depressed, Hopeless 0  PHQ - 2 Score 0  Altered sleeping -  Tired, decreased energy -  Change in appetite -  Feeling bad or failure about yourself  -  Trouble concentrating -  Moving slowly or fidgety/restless -  Suicidal thoughts -  PHQ-9 Score -  Difficult doing work/chores -   Health Maintenance Due  Topic Date Due  . HIV Screening  08/16/1981  . TETANUS/TDAP  08/16/1985    ROS: A complete review of systems was negative.   PMH:  The following were reviewed and entered/updated in epic: Past Medical History:  Diagnosis Date  . Depression   . Hypertension    no meds    Patient Active Problem List   Diagnosis Date Noted  . Dyslipidemia 10/06/2018   Past Surgical History:  Procedure Laterality Date  . no past surgeries      Family History  Problem Relation Age of Onset  . COPD Father   . Heart disease Father   . Arthritis Maternal Grandmother   . Heart attack Paternal Grandmother   . Heart attack Paternal Grandfather   . Colon cancer Neg Hx   . Colon polyps Neg Hx   . Esophageal cancer Neg Hx   . Rectal cancer Neg Hx   . Stomach cancer Neg Hx   . Pancreatic cancer Neg Hx   . Breast cancer Neg Hx     Medications- reviewed and updated Current Outpatient Medications  Medication Sig Dispense Refill  . Cholecalciferol (VITAMIN D3) 3000 units TABS Take 2,000 Units by mouth daily.    . Multiple Vitamin (MULTIVITAMIN) tablet Take 1 tablet by mouth daily.    Marland Kitchen OVER THE COUNTER MEDICATION Green tea     No current facility-administered medications for this visit.     Allergies-reviewed and updated No Known Allergies  Social History   Socioeconomic  History  . Marital status: Single    Spouse name: Not on file  . Number of children: Not on file  . Years of education: Not on file  . Highest education level: Not on file  Occupational History  . Not on file  Social Needs  . Financial resource strain: Not on file  . Food insecurity:    Worry: Not on file    Inability: Not on file  . Transportation needs:    Medical: Not on file    Non-medical: Not on file  Tobacco Use  . Smoking status: Never Smoker  . Smokeless tobacco: Never Used  Substance and Sexual Activity  . Alcohol use: Yes    Comment: occasionally   . Drug use: No  . Sexual activity: Not Currently  Lifestyle  . Physical activity:    Days per week: Not on file    Minutes per session: Not on file  . Stress: Not on file  Relationships  . Social connections:    Talks on phone: Not on file    Gets together: Not on file    Attends religious service: Not on file    Active member of club or organization: Not on file    Attends meetings of clubs or organizations: Not on file  Relationship status: Not on file  Other Topics Concern  . Not on file  Social History Narrative  . Not on file   Objective:  Physical Exam: BP 132/80 (BP Location: Left Arm, Patient Position: Sitting, Cuff Size: Normal)   Pulse 62   Temp 97.8 F (36.6 C) (Oral)   Ht 5\' 6"  (1.676 m)   Wt 170 lb (77.1 kg)   SpO2 98%   BMI 27.44 kg/m   Body mass index is 27.44 kg/m. Wt Readings from Last 3 Encounters:  10/06/18 170 lb (77.1 kg)  04/15/18 169 lb (76.7 kg)  04/11/18 173 lb 6.4 oz (78.7 kg)   Gen: NAD, resting comfortably HEENT: TMs normal bilaterally. OP clear. No thyromegaly noted.  CV: RRR with no murmurs appreciated Pulm: NWOB, CTAB with no crackles, wheezes, or rhonchi GI: Normal bowel sounds present. Soft, Nontender, Nondistended. MSK: no edema, cyanosis, or clubbing noted Skin: warm, dry Neuro: CN2-12 grossly intact. Strength 5/5 in upper and lower extremities. Reflexes  symmetric and intact bilaterally.  Psych: Normal affect and thought content  Assessment/Plan:  Dyslipidemia Check lipid panel.  Check CBC and CMP.   Preventative Healthcare: Declined flu shot.  Up-to-date on other vaccines and screenings.  Patient Counseling(The following topics were reviewed and/or handout was given):  -Nutrition: Stressed importance of moderation in sodium/caffeine intake, saturated fat and cholesterol, caloric balance, sufficient intake of fresh fruits, vegetables, and fiber.  -Stressed the importance of regular exercise.   -Substance Abuse: Discussed cessation/primary prevention of tobacco, alcohol, or other drug use; driving or other dangerous activities under the influence; availability of treatment for abuse.   -Injury prevention: Discussed safety belts, safety helmets, smoke detector, smoking near bedding or upholstery.   -Sexuality: Discussed sexually transmitted diseases, partner selection, use of condoms, avoidance of unintended pregnancy and contraceptive alternatives.   -Dental health: Discussed importance of regular tooth brushing, flossing, and dental visits.  -Health maintenance and immunizations reviewed. Please refer to Health maintenance section.  Return to care in 1 year for next preventative visit.   Algis Greenhouse. Jerline Pain, MD 10/06/2018 12:32 PM

## 2018-10-06 NOTE — Assessment & Plan Note (Signed)
Check lipid panel.  Check CBC and CMP.

## 2018-10-07 NOTE — Progress Notes (Signed)
Please inform patient of the following:  His cholesterol was a bit high but everything else was normal. Do not need to start meds, but would recommend working on diet and exercise and we can recheck in 1 year.  Algis Greenhouse. Jerline Pain, MD 10/07/2018 5:33 PM

## 2018-11-01 ENCOUNTER — Telehealth (INDEPENDENT_AMBULATORY_CARE_PROVIDER_SITE_OTHER): Payer: Self-pay | Admitting: Orthopaedic Surgery

## 2018-11-01 NOTE — Telephone Encounter (Signed)
Returned call to patient left message to call back to

## 2018-11-30 ENCOUNTER — Ambulatory Visit (INDEPENDENT_AMBULATORY_CARE_PROVIDER_SITE_OTHER): Payer: PRIVATE HEALTH INSURANCE | Admitting: Family Medicine

## 2018-11-30 ENCOUNTER — Encounter: Payer: Self-pay | Admitting: Family Medicine

## 2018-11-30 VITALS — BP 128/78 | HR 88 | Temp 98.7°F | Ht 66.0 in | Wt 174.8 lb

## 2018-11-30 DIAGNOSIS — R197 Diarrhea, unspecified: Secondary | ICD-10-CM

## 2018-11-30 DIAGNOSIS — R110 Nausea: Secondary | ICD-10-CM

## 2018-11-30 NOTE — Progress Notes (Signed)
   Chief Complaint:  Wesley Frost is a 53 y.o. male who presents for same day appointment with a chief complaint of diarrhea.   Assessment/Plan:  Diarrhea/nausea/vomiting Likely secondary to viral GI illness.  Given that he has not had any symptoms for the past 24 hours and that is safe for him to go back to work.  Letter was given to patient stating this.  Recommended continue use of over-the-counter medication such as Imodium as needed.  Encouraged good oral hydration.  Discussed reasons to return to care.  Follow-up as needed.     Subjective:  HPI:  Diarrhea, acute problem Started 3 days ago.  Improved over the last few days. No sick contacts or other obvious precipitating factors.  He had some associated nausea and vomiting that is since resolved.  Also had elevated fever a couple days ago is also since resolved.  No specific treatments tried.  Has not had any symptoms for the past 24 hours. No other obvious alleviating or aggravating factors.   ROS: Per HPI  PMH: He reports that he has never smoked. He has never used smokeless tobacco. He reports current alcohol use. He reports that he does not use drugs.      Objective:  Physical Exam: BP 128/78 (BP Location: Left Arm, Patient Position: Sitting, Cuff Size: Normal)   Pulse 88   Temp 98.7 F (37.1 C) (Oral)   Ht 5\' 6"  (1.676 m)   Wt 174 lb 12.8 oz (79.3 kg)   SpO2 97%   BMI 28.21 kg/m   Gen: NAD, resting comfortably HEENT: Moist mucous membranes CV: Regular rate and rhythm with no murmurs appreciated Pulm: Normal work of breathing, clear to auscultation bilaterally with no crackles, wheezes, or rhonchi GI: Normal bowel sounds present. Soft, Nontender, Nondistended.     Algis Greenhouse. Jerline Pain, MD 11/30/2018 11:49 AM

## 2019-01-01 IMAGING — DX DG WRIST COMPLETE 3+V*L*
4 series · 4 of 4 positions shown · non-contrast
Comparison: None.

CLINICAL DATA: 51-year-old male fell from ladder onto outstretched
hand. Pain posterior aspect left wrist/distal radius. Difficulty
with flexion ring finger. Initial encounter.

EXAM:
LEFT WRIST - COMPLETE 3+ VIEW

[wrist pa]
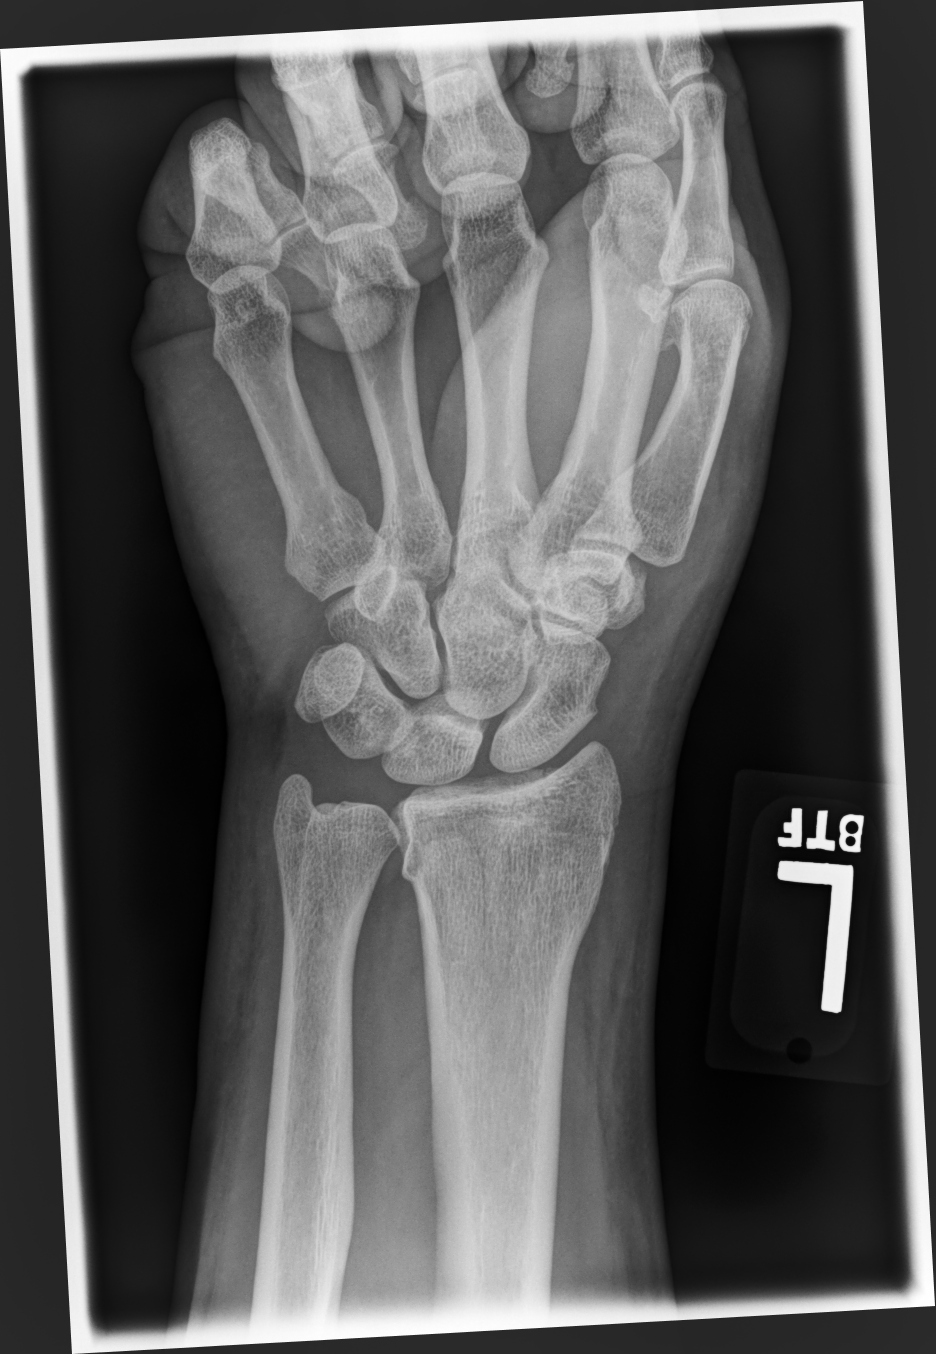

[wrist oblique]
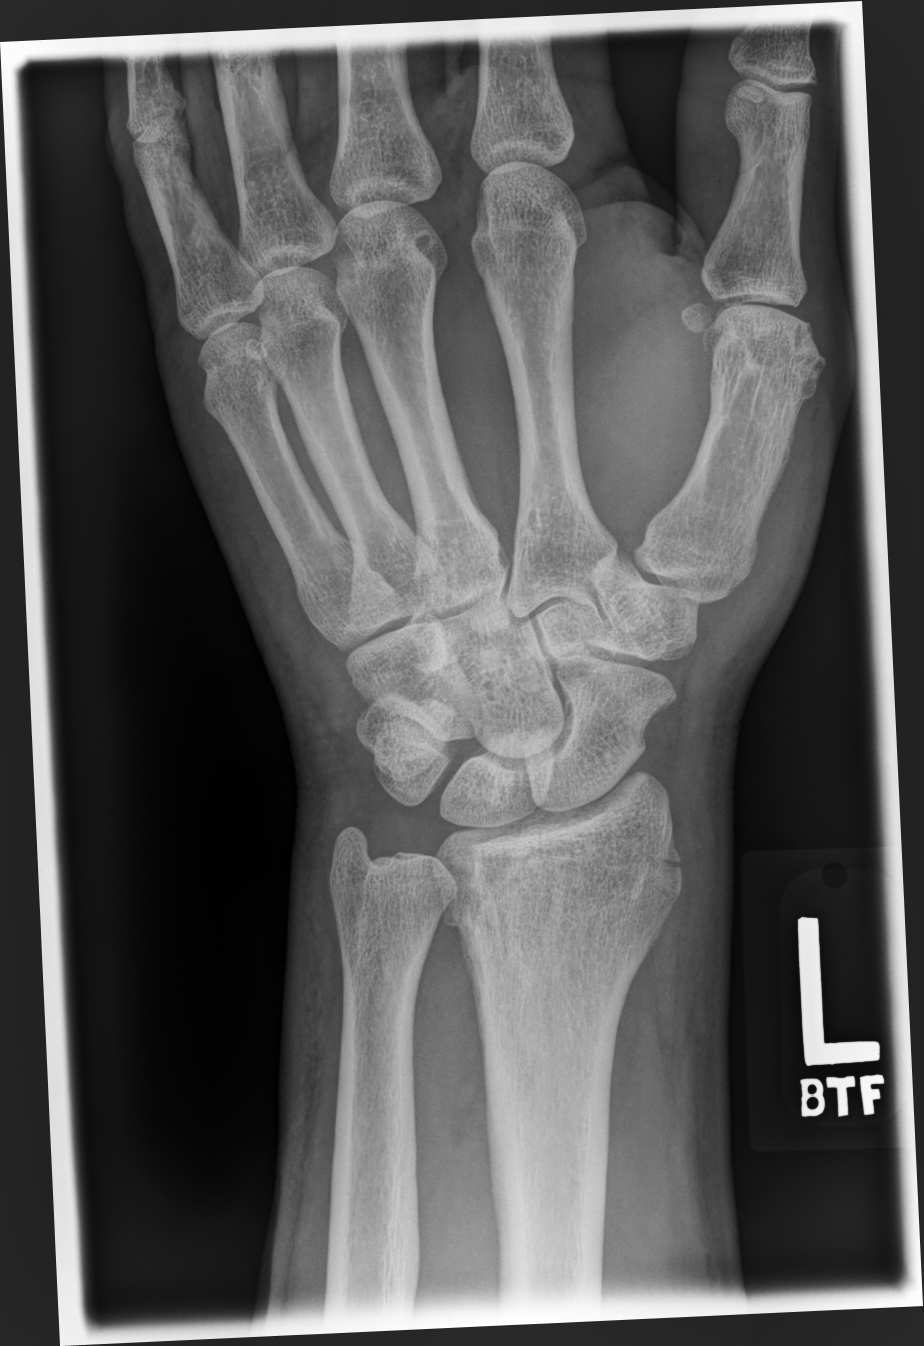

[wrist lat]
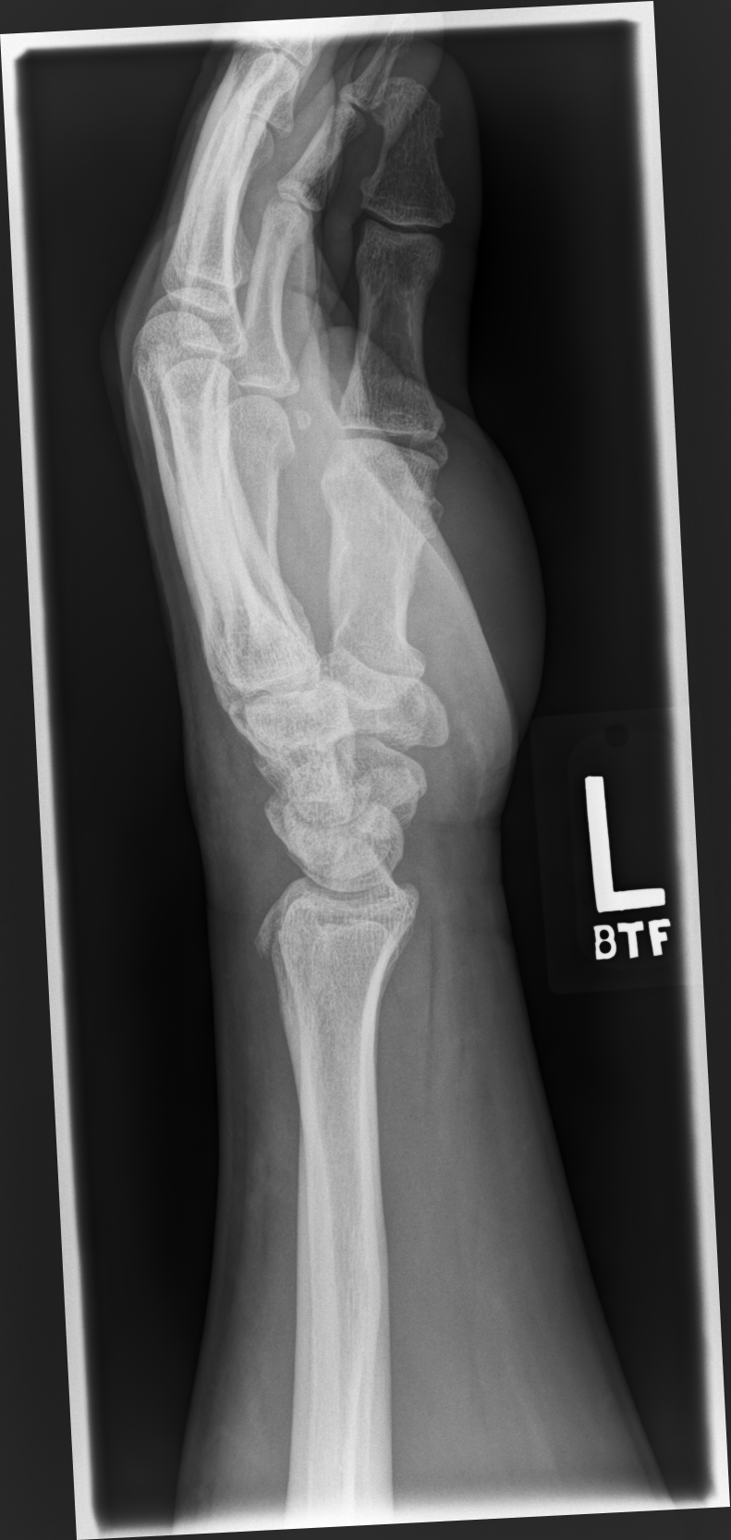

[navicular pa]
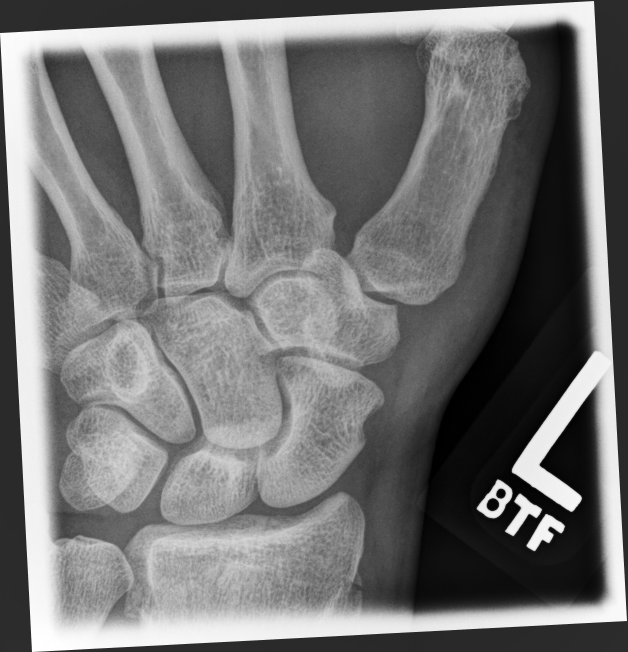

[4 of 4 positions shown; findings below may reference images not displayed]

FINDINGS: Fracture of the distal left radius most notable posterior and radial
aspect with intra-articular extension and possible buckling ulnar
aspect.

Associated pronator quadratus fat pad elevation.

No other fractures noted. The left ring finger is incompletely
assessed.
IMPRESSION: Comminuted fracture distal left radius with intra-articular
extension.

These results will be called to the ordering clinician or
representative by the Radiologist Assistant, and communication
documented in the PACS or zVision Dashboard.

## 2020-11-07 ENCOUNTER — Other Ambulatory Visit: Payer: PRIVATE HEALTH INSURANCE

## 2020-12-31 ENCOUNTER — Encounter: Payer: Self-pay | Admitting: Internal Medicine

## 2022-07-13 ENCOUNTER — Encounter: Payer: Self-pay | Admitting: *Deleted

## 2022-10-01 ENCOUNTER — Encounter: Payer: Self-pay | Admitting: *Deleted
